# Patient Record
Sex: Female | Born: 1955 | Race: White | Hispanic: No | Smoking: Never smoker
Health system: Southern US, Community
[De-identification: ages and names within clinical notes are randomized; demographics above are authoritative.]

## PROBLEM LIST (undated history)

## (undated) DIAGNOSIS — J45909 Unspecified asthma, uncomplicated: Secondary | ICD-10-CM

## (undated) DIAGNOSIS — D219 Benign neoplasm of connective and other soft tissue, unspecified: Secondary | ICD-10-CM

## (undated) DIAGNOSIS — F419 Anxiety disorder, unspecified: Secondary | ICD-10-CM

## (undated) DIAGNOSIS — F329 Major depressive disorder, single episode, unspecified: Secondary | ICD-10-CM

## (undated) DIAGNOSIS — F32A Depression, unspecified: Secondary | ICD-10-CM

## (undated) HISTORY — DX: Unspecified asthma, uncomplicated: J45.909

## (undated) HISTORY — DX: Depression, unspecified: F32.A

## (undated) HISTORY — DX: Benign neoplasm of connective and other soft tissue, unspecified: D21.9

## (undated) HISTORY — DX: Anxiety disorder, unspecified: F41.9

## (undated) HISTORY — PX: DILATION AND CURETTAGE OF UTERUS: SHX78

## (undated) HISTORY — DX: Major depressive disorder, single episode, unspecified: F32.9

---

## 1998-12-18 ENCOUNTER — Other Ambulatory Visit: Admission: RE | Admit: 1998-12-18 | Discharge: 1998-12-18 | Payer: Self-pay | Admitting: *Deleted

## 2000-04-15 ENCOUNTER — Other Ambulatory Visit: Admission: RE | Admit: 2000-04-15 | Discharge: 2000-04-15 | Payer: Self-pay | Admitting: *Deleted

## 2001-12-21 ENCOUNTER — Other Ambulatory Visit: Admission: RE | Admit: 2001-12-21 | Discharge: 2001-12-21 | Payer: Self-pay | Admitting: *Deleted

## 2003-03-21 ENCOUNTER — Encounter (INDEPENDENT_AMBULATORY_CARE_PROVIDER_SITE_OTHER): Payer: Self-pay | Admitting: *Deleted

## 2003-03-21 ENCOUNTER — Other Ambulatory Visit: Admission: RE | Admit: 2003-03-21 | Discharge: 2003-03-21 | Payer: Self-pay | Admitting: *Deleted

## 2004-01-31 ENCOUNTER — Other Ambulatory Visit: Admission: RE | Admit: 2004-01-31 | Discharge: 2004-01-31 | Payer: Self-pay | Admitting: *Deleted

## 2005-08-25 ENCOUNTER — Ambulatory Visit: Payer: Self-pay | Admitting: Internal Medicine

## 2005-10-16 ENCOUNTER — Other Ambulatory Visit: Admission: RE | Admit: 2005-10-16 | Discharge: 2005-10-16 | Payer: Self-pay | Admitting: *Deleted

## 2006-07-07 ENCOUNTER — Ambulatory Visit: Payer: Self-pay | Admitting: Internal Medicine

## 2006-09-03 ENCOUNTER — Ambulatory Visit: Payer: Self-pay | Admitting: Internal Medicine

## 2006-10-15 ENCOUNTER — Other Ambulatory Visit: Admission: RE | Admit: 2006-10-15 | Discharge: 2006-10-15 | Payer: Self-pay | Admitting: *Deleted

## 2007-10-19 ENCOUNTER — Other Ambulatory Visit: Admission: RE | Admit: 2007-10-19 | Discharge: 2007-10-19 | Payer: Self-pay | Admitting: *Deleted

## 2008-09-25 ENCOUNTER — Ambulatory Visit: Payer: Self-pay | Admitting: Internal Medicine

## 2008-09-25 DIAGNOSIS — J45909 Unspecified asthma, uncomplicated: Secondary | ICD-10-CM | POA: Insufficient documentation

## 2008-09-25 DIAGNOSIS — J309 Allergic rhinitis, unspecified: Secondary | ICD-10-CM | POA: Insufficient documentation

## 2009-01-15 ENCOUNTER — Ambulatory Visit: Payer: Self-pay | Admitting: Internal Medicine

## 2009-01-15 LAB — CONVERTED CEMR LAB
AST: 20 units/L (ref 0–37)
Albumin: 4 g/dL (ref 3.5–5.2)
Alkaline Phosphatase: 50 units/L (ref 39–117)
BUN: 11 mg/dL (ref 6–23)
Bilirubin, Direct: 0.1 mg/dL (ref 0.0–0.3)
Blood in Urine, dipstick: NEGATIVE
Chloride: 108 meq/L (ref 96–112)
Eosinophils Absolute: 0.2 10*3/uL (ref 0.0–0.7)
Eosinophils Relative: 4.9 % (ref 0.0–5.0)
GFR calc Af Amer: 97 mL/min
GFR calc non Af Amer: 80 mL/min
HDL: 82.9 mg/dL (ref >39.0)
Ketones, urine, test strip: NEGATIVE
Monocytes Relative: 9.1 % (ref 3.0–12.0)
Neutrophils Relative %: 52.5 % (ref 43.0–77.0)
Nitrite: NEGATIVE
Platelets: 163 10*3/uL (ref 150–400)
Potassium: 3.9 meq/L (ref 3.5–5.1)
Protein, U semiquant: NEGATIVE
RDW: 13.4 % (ref 11.5–14.6)
Sodium: 143 meq/L (ref 135–145)
TSH: 2.65 microintl units/mL (ref 0.35–5.50)
Total CHOL/HDL Ratio: 2.3
Triglycerides: 45 mg/dL (ref 0–149)
Urobilinogen, UA: 0.2
VLDL: 9 mg/dL (ref 0–40)
WBC Urine, dipstick: NEGATIVE
WBC: 3.3 10*3/uL — ABNORMAL LOW (ref 4.5–10.5)

## 2009-01-17 LAB — CONVERTED CEMR LAB: Vit D, 25-Hydroxy: 28 ng/mL — ABNORMAL LOW (ref 30–89)

## 2009-01-22 ENCOUNTER — Ambulatory Visit: Payer: Self-pay | Admitting: Internal Medicine

## 2009-01-22 DIAGNOSIS — M899 Disorder of bone, unspecified: Secondary | ICD-10-CM | POA: Insufficient documentation

## 2009-01-22 DIAGNOSIS — M949 Disorder of cartilage, unspecified: Secondary | ICD-10-CM

## 2009-04-18 ENCOUNTER — Ambulatory Visit: Payer: Self-pay | Admitting: Internal Medicine

## 2009-04-18 LAB — CONVERTED CEMR LAB: Vit D, 25-Hydroxy: 41 ng/mL (ref 30–89)

## 2009-04-27 ENCOUNTER — Ambulatory Visit: Payer: Self-pay | Admitting: Internal Medicine

## 2009-04-27 DIAGNOSIS — T753XXA Motion sickness, initial encounter: Secondary | ICD-10-CM

## 2010-12-15 LAB — CONVERTED CEMR LAB: Pap Smear: NORMAL

## 2011-05-12 ENCOUNTER — Emergency Department (HOSPITAL_COMMUNITY): Payer: BC Managed Care – PPO

## 2011-05-12 ENCOUNTER — Emergency Department (HOSPITAL_COMMUNITY)
Admission: EM | Admit: 2011-05-12 | Discharge: 2011-05-12 | Disposition: A | Discharge status: Home / Self Care | Payer: BC Managed Care – PPO | Attending: Emergency Medicine | Admitting: Emergency Medicine

## 2011-05-12 DIAGNOSIS — R110 Nausea: Secondary | ICD-10-CM | POA: Insufficient documentation

## 2011-05-12 DIAGNOSIS — R1031 Right lower quadrant pain: Secondary | ICD-10-CM | POA: Insufficient documentation

## 2011-05-12 LAB — POCT I-STAT, CHEM 8
BUN: 7 mg/dL (ref 6–23)
Calcium, Ion: 1.22 mmol/L (ref 1.12–1.32)
Chloride: 107 mEq/L (ref 96–112)
Creatinine, Ser: 0.7 mg/dL (ref 0.50–1.10)
Glucose, Bld: 81 mg/dL (ref 70–99)
TCO2: 24 mmol/L (ref 0–100)

## 2011-05-12 LAB — URINALYSIS, ROUTINE W REFLEX MICROSCOPIC
Bilirubin Urine: NEGATIVE
Glucose, UA: NEGATIVE mg/dL
Hgb urine dipstick: NEGATIVE
Leukocytes, UA: NEGATIVE
Protein, ur: NEGATIVE mg/dL
pH: 5 (ref 5.0–8.0)

## 2011-05-12 LAB — DIFFERENTIAL
Basophils Relative: 1 % (ref 0–1)
Lymphs Abs: 1.2 10*3/uL (ref 0.7–4.0)
Monocytes Absolute: 0.4 10*3/uL (ref 0.1–1.0)
Monocytes Relative: 9 % (ref 3–12)
Neutro Abs: 2.2 10*3/uL (ref 1.7–7.7)

## 2011-05-12 LAB — CBC
Hemoglobin: 14.4 g/dL (ref 12.0–15.0)
MCH: 30.2 pg (ref 26.0–34.0)
MCHC: 33.4 g/dL (ref 30.0–36.0)
MCV: 90.4 fL (ref 78.0–100.0)
RBC: 4.77 MIL/uL (ref 3.87–5.11)

## 2011-05-12 MED ORDER — IOHEXOL 300 MG/ML  SOLN
100.0000 mL | Freq: Once | INTRAMUSCULAR | Status: AC | PRN
  Administered 2011-05-12: 100 mL via INTRAVENOUS

## 2013-06-18 ENCOUNTER — Ambulatory Visit (INDEPENDENT_AMBULATORY_CARE_PROVIDER_SITE_OTHER): Payer: BC Managed Care – PPO | Admitting: Internal Medicine

## 2013-06-18 ENCOUNTER — Ambulatory Visit: Payer: BC Managed Care – PPO

## 2013-06-18 VITALS — BP 140/88 | HR 65 | Temp 98.6°F | Resp 18 | Ht 65.0 in | Wt 127.0 lb

## 2013-06-18 DIAGNOSIS — S93409A Sprain of unspecified ligament of unspecified ankle, initial encounter: Secondary | ICD-10-CM

## 2013-06-18 DIAGNOSIS — M25571 Pain in right ankle and joints of right foot: Secondary | ICD-10-CM

## 2013-06-18 DIAGNOSIS — M25579 Pain in unspecified ankle and joints of unspecified foot: Secondary | ICD-10-CM

## 2013-06-18 NOTE — Progress Notes (Signed)
  Subjective:    Patient ID: Savannah Bird, female    DOB: [birth year], 57 y.o.   MRN: [id number]  HPI tripped with ankle hyper plantar flexed yesterday--immediate swelling laterally Today swelling bimalleolar with painful gait but is able to walk    Review of Systems     Objective:   Physical Exam BP 140/88  Pulse 65  Temp(Src) 98.6 F (37 C)  Resp 18  Ht 5\' 5"  (1.651 m)  Wt 127 lb (57.607 kg)  BMI 21.13 kg/m2 No acute distress Right ankle swollen over lateral and medial epicondyles with some ecchymoses Tender lateral ligaments/tender deltoid/tender over fifth metatarsal proximally/mild tenderness over the dorsum of the foot Good range of motion without laxity Achilles intact   [facility] reading (PRIMARY) by  Dr. Kris No=No Fx      Assessment & Plan:  Bimalleolar ankle sprain  Swedo Brace Exercises Ice and elevate Recheck 3 weeks

## 2013-08-03 ENCOUNTER — Encounter: Payer: Self-pay | Admitting: Certified Nurse Midwife

## 2013-08-03 ENCOUNTER — Encounter: Payer: Self-pay | Admitting: Internal Medicine

## 2013-08-03 ENCOUNTER — Ambulatory Visit (INDEPENDENT_AMBULATORY_CARE_PROVIDER_SITE_OTHER): Payer: BC Managed Care – PPO | Admitting: Certified Nurse Midwife

## 2013-08-03 ENCOUNTER — Other Ambulatory Visit: Payer: Self-pay | Admitting: Certified Nurse Midwife

## 2013-08-03 VITALS — BP 102/62 | HR 64 | Resp 16 | Ht 64.75 in | Wt 128.0 lb

## 2013-08-03 DIAGNOSIS — E559 Vitamin D deficiency, unspecified: Secondary | ICD-10-CM

## 2013-08-03 DIAGNOSIS — Z0000 Encounter for general adult medical examination without abnormal findings: Secondary | ICD-10-CM

## 2013-08-03 DIAGNOSIS — R5381 Other malaise: Secondary | ICD-10-CM

## 2013-08-03 DIAGNOSIS — Z01419 Encounter for gynecological examination (general) (routine) without abnormal findings: Secondary | ICD-10-CM

## 2013-08-03 LAB — POCT URINALYSIS DIPSTICK
Bilirubin, UA: NEGATIVE
Blood, UA: NEGATIVE
Ketones, UA: NEGATIVE
Leukocytes, UA: NEGATIVE
Protein, UA: NEGATIVE
pH, UA: 5

## 2013-08-03 NOTE — Progress Notes (Signed)
57 y.o. g1p1001. Divorced Caucasian Fe here to establish gyn care and  for annual exam. Menopausal no HRT. Denies vaginal bleeding or vaginal dryness. Injured right foot with sprain 6 weeks ago, healing, but slowly.  Complaining of heat intolerance and fatigue over the past few weeks. Patient is vegetarian but eats seafood, and eggs. Water intake is adequate. Feels she can do her job and household responsibilities. 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 History of depression, but feels that she is fine. No other health issues today. Sees PCP prn.  Patient's last menstrual period was 06/17/2009.          Sexually active: no  The current method of family planning is abstinence.    Exercising: yes  walk,run & weights Smoker:  no  Health Maintenance: Pap:  2010 negative MMG:  2009 negative Colonoscopy: none BMD: none TDaP:  2010 Labs: Poct urine-neg, Hgb-13.4 Self breast exam: done monthly   reports that she has never smoked. She does not have any smokeless tobacco history on file. She reports that she drinks about 0.6 ounces of alcohol per week. She reports that she does not use illicit drugs.  Past Medical History  Diagnosis Date  . Childhood asthma   . Depression   . Anxiety   . Fibroid     Past Surgical History  Procedure Laterality Date  . Cesarean section    . Dilation and curettage of uterus      No current outpatient prescriptions on file.   No current facility-administered medications for this visit.    Family History  Problem Relation Age of Onset  . Thyroid disease Mother   . Stroke Father   . Heart disease Father   . Hypertension Father   . Cancer Father     bladder & kidney  . Aneurysm Maternal Grandmother   . Heart disease Maternal Grandfather   . Cancer Paternal Grandmother     ovarian    ROS:  Pertinent items are noted in HPI.  Otherwise, a comprehensive ROS was negative.  Exam:   BP 102/62  Pulse 64  Resp 16  Ht 5' 4.75"  (1.645 m)  Wt 128 lb (58.06 kg)  BMI 21.46 kg/m2  LMP 06/17/2009 Height: 5' 4.75" (164.5 cm)  Ht Readings from Last 3 Encounters:  08/03/13 5' 4.75" (1.645 m)  06/18/13 5\' 5"  (1.651 m)  04/27/09 5\' 5"  (1.651 m)    General appearance: alert, cooperative and appears stated age Head: Normocephalic, without obvious abnormality, atraumatic Neck: no adenopathy, supple, symmetrical, trachea midline and thyroid normal to inspection and palpation Lungs: clear to auscultation bilaterally Breasts: normal appearance, no masses or tenderness, No nipple retraction or dimpling, No nipple discharge or bleeding, No axillary or supraclavicular adenopathy Heart: regular rate and rhythm Abdomen: soft, non-tender; no masses,  no organomegaly Extremities: extremities normal, atraumatic, no cyanosis or edema Skin: Skin color, texture, turgor normal. No rashes or lesions Lymph nodes: Cervical, supraclavicular, and axillary nodes normal. No abnormal inguinal nodes palpated Neurologic: Grossly normal   Pelvic: External genitalia:  no lesions              Urethra:  normal appearing urethra with no masses, tenderness or lesions              Bartholin's and Skene's: normal                 Vagina: normal appearing vagina with normal color and discharge, no lesions  Cervix: normal, non tender              Pap taken: yes Bimanual Exam:  Uterus:  normal size, contour, position, consistency, mobility, non-tender and mid position              Adnexa: normal adnexa and no mass, fullness, tenderness               Rectovaginal: Confirms               Anus:  normal sphincter tone, no lesions  A:  Well Woman with normal exam  Menopausal no HRT  Fatigue with Hot and cold intoerance,but employment is body work business( very physical)  History of depression(declines symptoms at present)  P:   Reviewed health and wellness pertinent to exam  Aware of need to evaluate if vaginal bleeding occurs  Discussed  physical work may be a contributing factor. Also discussed depression can also cause fatigue like symptoms. Encouraged multivitamin with B complex, increase protein in diet and rest. Lab:CMP,Lipid panel,CBC with diff, thyroid panel with TSH, Vit. D  Pap smear as per guidelines   Mammogram yearly pap smear taken with HPVHR   counseled on breast self exam, mammography screening, use and side effects of HRT, adequate intake of calcium and vitamin D, diet and exercise, Kegel's exercises  return annually or prn  An After Visit Summary was printed and given to the patient.

## 2013-08-03 NOTE — Patient Instructions (Signed)

## 2013-08-04 LAB — COMPREHENSIVE METABOLIC PANEL
AST: 17 U/L (ref 0–37)
BUN: 10 mg/dL (ref 6–23)
Calcium: 10.5 mg/dL (ref 8.4–10.5)
Chloride: 105 mEq/L (ref 96–112)
Creat: 0.75 mg/dL (ref 0.50–1.10)
Total Bilirubin: 0.6 mg/dL (ref 0.3–1.2)

## 2013-08-04 LAB — CBC WITH DIFFERENTIAL/PLATELET
Basophils Absolute: 0 10*3/uL (ref 0.0–0.1)
Basophils Relative: 1 % (ref 0–1)
Eosinophils Absolute: 0.3 10*3/uL (ref 0.0–0.7)
Eosinophils Relative: 5 % (ref 0–5)
MCH: 30.6 pg (ref 26.0–34.0)
MCV: 92.7 fL (ref 78.0–100.0)
Neutrophils Relative %: 55 % (ref 43–77)
Platelets: 235 10*3/uL (ref 150–400)
RDW: 13.4 % (ref 11.5–15.5)

## 2013-08-04 LAB — VITAMIN D 25 HYDROXY (VIT D DEFICIENCY, FRACTURES): Vit D, 25-Hydroxy: 46 ng/mL (ref 30–89)

## 2013-08-04 LAB — LIPID PANEL
Cholesterol: 190 mg/dL (ref 0–200)
HDL: 74 mg/dL (ref >39)
LDL Cholesterol: 98 mg/dL (ref 0–99)
Triglycerides: 90 mg/dL (ref <150)
VLDL: 18 mg/dL (ref 0–40)

## 2013-08-04 LAB — THYROID PANEL WITH TSH
Free Thyroxine Index: 2.9 (ref 1.0–3.9)
T4, Total: 9.2 ug/dL (ref 5.0–12.5)
TSH: 1.788 u[IU]/mL (ref 0.350–4.500)

## 2013-08-11 NOTE — Progress Notes (Signed)
Note reviewed, agree with plan.  Lucindia Lemley, MD  

## 2013-09-22 ENCOUNTER — Other Ambulatory Visit: Payer: Self-pay

## 2013-10-06 ENCOUNTER — Encounter: Payer: Self-pay | Admitting: Certified Nurse Midwife

## 2013-10-06 ENCOUNTER — Telehealth: Payer: Self-pay | Admitting: Certified Nurse Midwife

## 2013-10-06 ENCOUNTER — Ambulatory Visit (INDEPENDENT_AMBULATORY_CARE_PROVIDER_SITE_OTHER): Payer: BC Managed Care – PPO | Admitting: Certified Nurse Midwife

## 2013-10-06 VITALS — Temp 98.3°F | Ht 64.75 in | Wt 130.0 lb

## 2013-10-06 DIAGNOSIS — B373 Candidiasis of vulva and vagina: Secondary | ICD-10-CM

## 2013-10-06 DIAGNOSIS — N390 Urinary tract infection, site not specified: Secondary | ICD-10-CM

## 2013-10-06 LAB — POCT URINALYSIS DIPSTICK
Blood, UA: NEGATIVE
Nitrite, UA: NEGATIVE
Protein, UA: NEGATIVE
Urobilinogen, UA: NEGATIVE
pH, UA: 5

## 2013-10-06 MED ORDER — NITROFURANTOIN MONOHYD MACRO 100 MG PO CAPS: 100.0000 mg | ORAL_CAPSULE | Freq: Two times a day (BID) | ORAL | Status: DC

## 2013-10-06 NOTE — Progress Notes (Signed)
S:  @57  y.o.Divorced Caucasian female presents with 3 days of UTI  symptoms of urinary frequency, clear vaginal discharge, burning in bladder area. Onset of symptoms for. Pertinent negatives include The patient is having no constitutional symptoms, denying fever, chills, anorexia, or weight loss.. Patient had viral illness or food poisoning with diarrhea, fever and chills a week ago, has resolved. Drinking good amount of water. Patient has taken Goldenseal and berry extract for UTI with some relief. Patient has some vaginal discomfort but no itching or burning?Marland Kitchen No new personal products. ROS: pertinent to above.  O alert, oriented to person, place, and time   healthy, well developed and well nourished  Skin warm and dry  CVAT negative bilateral  Abdomen: slight suprapubic tenderness  Pelvic Exam:External genital area normal no lesions  Bladder,urethra, urethral meatus tender  Vagina: moist with clear discharge, slight increase pink     Ph 4.0 Wet prep taken  Uterus: normal, non tender  Adnexa: normal, non tender, no masses palpated  Perineal area: no lesions or redness   POCT urine: negative Wet prep: positive yeast    Assessment:Symptomatic UTI Yeast vaginitis  P: Discussed findings and need for treatment. Patient agreeable. Rx Macrobid see order Lab: Urine culture  Maintain adequate hydration. Follow up if symptoms not improving, and as needed. Discussed treatment for yeast. Patient declined will treat with probiotics and Kefer. Will advise if not resolving   RV prn

## 2013-10-10 ENCOUNTER — Encounter: Payer: Self-pay | Admitting: Certified Nurse Midwife

## 2013-10-10 ENCOUNTER — Telehealth: Payer: Self-pay

## 2013-10-10 NOTE — Telephone Encounter (Signed)
Message copied by Eliezer Bottom on Mon Oct 10, 2013  4:48 PM ------      Message from: Verner Chol      Created: Mon Oct 10, 2013 12:39 PM       Notify patient urine culture negative      No TOC needed      Patient status ------

## 2013-10-10 NOTE — Telephone Encounter (Signed)
[email or web address]

## 2013-10-10 NOTE — Progress Notes (Signed)
Note reviewed, agree with plan.  Nikeya Maxim, MD  

## 2013-10-18 NOTE — Telephone Encounter (Signed)
Left message for call back.

## 2013-10-20 NOTE — Telephone Encounter (Signed)
Patient notified of results.

## 2014-08-05 ENCOUNTER — Ambulatory Visit (INDEPENDENT_AMBULATORY_CARE_PROVIDER_SITE_OTHER): Payer: BC Managed Care – PPO

## 2014-08-05 ENCOUNTER — Telehealth: Payer: Self-pay | Admitting: *Deleted

## 2014-08-05 ENCOUNTER — Ambulatory Visit (INDEPENDENT_AMBULATORY_CARE_PROVIDER_SITE_OTHER): Payer: BC Managed Care – PPO | Admitting: Internal Medicine

## 2014-08-05 VITALS — BP 110/80 | HR 63 | Temp 98.0°F | Resp 16 | Ht 66.0 in | Wt 131.0 lb

## 2014-08-05 DIAGNOSIS — S5000XA Contusion of unspecified elbow, initial encounter: Secondary | ICD-10-CM

## 2014-08-05 DIAGNOSIS — M25529 Pain in unspecified elbow: Secondary | ICD-10-CM

## 2014-08-05 DIAGNOSIS — S5002XA Contusion of left elbow, initial encounter: Secondary | ICD-10-CM

## 2014-08-05 DIAGNOSIS — M25522 Pain in left elbow: Secondary | ICD-10-CM

## 2014-08-05 NOTE — Telephone Encounter (Signed)
Patient notified that radiologist see's a possible fracture in her right elbow. She is to follow up in 7 -10 days with Dr. Elder Cyphers or Dr. Laney Pastor per Dr. Elder Cyphers.

## 2014-08-05 NOTE — Progress Notes (Signed)
   Subjective:    Patient ID: Savannah Bird, female    DOB: 1956-08-18, 58 y.o.   MRN: [id number]  HPI    Review of Systems     Objective:   Physical Exam        Assessment & Plan:

## 2014-08-05 NOTE — Progress Notes (Signed)
   Subjective:    Patient ID: Savannah Bird, female    DOB: 1956-05-26, 58 y.o.   MRN: [id number]  HPI A 58 year old female is here with an left elbow injury that occurred on Friday 08/04/2014.Marland Kitchen  Patient states she fell and can not straighten her arm; however she is able to bend it across the chest.  She states it feels like an fracture to her.  Her elbow is presented with some pain and mild edema.   Review of Systems     Objective:   Physical Exam  Constitutional: She appears well-developed and well-nourished. She appears distressed.  HENT:  Head: Normocephalic.  Eyes: EOM are normal.  Neck: Normal range of motion.  Pulmonary/Chest: Effort normal.  Musculoskeletal:       Left elbow: She exhibits decreased range of motion, swelling and effusion. She exhibits no deformity and no laceration. Tenderness found. Lateral epicondyle tenderness noted. No radial head, no medial epicondyle and no olecranon process tenderness noted.  Neurological: She has normal strength. No cranial nerve deficit or sensory deficit.  Skin: Skin is intact. Ecchymosis noted. No abrasion and no laceration noted.     [facility] reading (PRIMARY) by  Dr.Guest possible impaction radial head, sts around distal humerus above joint, fat pad.  Urgent read please     Assessment & Plan:  Contusion elbow with fat pad sign RICE/Sling

## 2014-08-05 NOTE — Patient Instructions (Signed)
Elbow Fracture, Distal Humerus with Rehab A break (fracture) of the elbow occurs in one or more of the three bones of the elbow: the arm bone (humerus), the end of the thumb-side forearm bone (radial head) or the end of the pinky-side forearm bone (olecranon). Elbow fractures may be complete or incomplete. The following discussion does not address radial head fractures. SYMPTOMS   Severe elbow and arm pain at the time of injury.  Tenderness, inflammation, and later bruising (contusion) of the elbow (within 48 hours).  Visible deformity if the fracture is complete and the bone fragments are not aligned properly (displaced).  Numbness, coldness, or paralysis in the elbow, forearm, or hand from pressure on the blood vessels or nerves (uncommon). CAUSES  An elbow fracture occurs when a force is placed on the bone that is greater than it can withstand. Typical mechanisms of injury include:  Direct trauma to the elbow.  Twisting injury to the elbow.  Indirect stress due to falling on an outstretched hand with the elbow locked. RISK INCREASES WITH:   Contact sports (football or rugby).  Children under 33 years of age.  History of bone or joint disease (i.e., osteoporosis or bone tumor).  Poor strength and flexibility. PREVENTION   Warm up and stretch properly before activity.  Maintain physical fitness:  Strength, flexibility, and endurance.  Cardiovascular fitness.  When appropriate, wear properly fitted and padded elbow protection. PROGNOSIS  If treated properly, elbow fractures typically heal within 6 to 8 weeks for adults and 4 to 6 weeks in children.  RELATED COMPLICATIONS   The ends of the fractured bone do not come together (nonunion).  Improper alignment after healing (malunion) of the fracture.  Chronic pain, stiffness, loss of motion, or swelling of the elbow.  Excessive bleeding in the elbow or at the fracture site, causing pressure and injury to nerves and blood  vessels (uncommon).  Calcification of the soft tissues around the elbow (heterotopic ossification).  Risk of bone death due to interrupted blood supply associated with the fracture.  Unstable or arthritic joint following repeated injury.  Arrest of normal bone growth in children.  Atrophy, weakness, stiffness, numbness, and poor control of the hand due to injury to blood vessels, nerves, cartilage, muscle, ligaments, and fascia. TREATMENT  If the fracture is displaced, it must be put back in proper alignment (reduced) by an individual trained in the procedure. Oftentimes, displaced fractures cannot be reduced manually and require surgery. Once the bones are properly aligned (with or without surgery), ice and medication can be used to help reduce the pain and inflammation. The elbow should be immobilized for at least 6 weeks. After immobilization it is important to complete strengthening and stretching exercises to regain strength and a full range of motion. Theses exercises may be completed at home or with a therapist.  MEDICATION   If pain medication is necessary, then nonsteroidal anti-inflammatory medications, such as aspirin and ibuprofen, or other minor pain relievers, such as acetaminophen, are often recommended.  Do not take pain medication within 7 days before surgery.  Prescription pain relievers may be given if deemed necessary by your caregiver. Use only as directed and only as much as you need. COLD THERAPY  Cold treatment (icing) relieves pain and reduces inflammation. Cold treatment should be applied for 10 to 15 minutes every 2 to 3 hours for inflammation and pain and immediately after any activity that aggravates your symptoms. Use ice packs or an ice massage. SEEK MEDICAL CARE IF:  Pain, tenderness, or swelling worsens despite treatment.  You experience pain, numbness, or coldness in the hand.  Blue, gray, or dusky color appears in the fingernails.  Any of the following  occur after surgery: fever, increased pain, swelling, redness, drainage, or bleeding in the surgical area.  New, unexplained symptoms develop (drugs used in treatment may produce side effects). EXERCISES  RANGE OF MOTION (ROM) AND STRETCHING EXERCISES - Elbow Fracture (Distal Humerus, Olecranon) These exercises may help you restore your elbow mobility once your physician has discontinued your immobilization period. Your symptoms may resolve with or without further involvement from your physician, physical therapist, or athletic trainer. While completing these exercises, remember:   Restoring tissue flexibility helps normal motion to return to the joints. This allows healthier, less painful movement and activity.  An effective stretch should be held for at least 30 seconds.  A stretch should never be painful. You should only feel a gentle lengthening or release in the stretched tissue. RANGE OF MOTION - Supination, Active-Assisted  Sit with your right / left elbow bent 90 degrees and rest your forearm on a table.  Keeping your upper body and shoulder in place, roll your forearm so your palm faces upward. When you can go no farther, use your opposite hand to help until you feel a gentle to moderate stretch. Hold for __________ seconds.  Slowly release the stretch and return to the starting position. Repeat __________ times. Complete this exercise __________ times per day. RANGE OF MOTION - Pronation, Active-Assisted  Sit with your right / left elbow bent 90 degrees and rest your forearm on a table.  Keeping your upper body and shoulder in place, roll your forearm so your palm faces the tabletop. When you can go no farther, use your opposite hand to help until you feel a gentle to moderate stretch. Hold for __________ seconds.  Slowly release the stretch and return to the starting position. Repeat __________ times. Complete this exercise __________ times per day. RANGE OF MOTION -  Extension  Hold your right / left arm at your side and straighten your elbow as far as you can using your right / left arm muscles.  Straighten the right / left elbow farther by gently pushing down on your forearm until you feel a gentle stretch on the inside of your elbow. Hold this position for __________ seconds.  Slowly return to the starting position. Repeat __________ times. Complete this exercise __________ times per day.  RANGE OF MOTION - Flexion  Hold your right / left arm at your side and bend your elbow as far as you can using your right / left arm muscles.  Bend the right / left elbow farther by gently pushing up on your forearm until you feel a gentle stretch on the outside of your elbow. Hold this position for __________ seconds.  Slowly return to the starting position. Repeat __________ times. Complete this exercise __________ times per day.  RANGE OF MOTION - Supination, Active  Stand or sit with your elbows at your side. Bend your right / left elbow to 90 degrees.  Turn your palm upward until you feel a gentle stretch on the inside of your forearm.  Hold this position for __________ seconds. Slowly release and return to the starting position. Repeat __________ times. Complete this stretch __________ times per day.  RANGE OF MOTION - Pronation, Active  Stand or sit with your elbows at your side. Bend your right / left elbow to 90 degrees.  Turn your palm downward until you feel a gentle stretch on the top of your forearm.  Hold this position for __________ seconds. Slowly release and return to the starting position. Repeat __________ times. Complete this stretch __________ times per day.  RANGE OF MOTION - Elbow Flexion, Supine  Lie on your back. Extend your right / left arm into the air, bracing it with your opposite hand. Allow your right / left arm to relax.  Let your elbow bend, allowing your hand to fall slowly toward your chest.  You should feel a gentle  stretch along the back of your upper arm and/or elbow. Your physician, physical therapist or athletic trainer may ask you to hold a __________ hand weight to increase the intensity of this stretch.  Hold for __________ seconds. Slowly return your right / left arm to the upright position. Repeat __________ times. Complete this exercise __________ times per day. STRETCH - Elbow Flexors  Lie on a firm bed or countertop on your back. Be sure that you are in a comfortable position which will allow you to relax your arm muscles.  Place a folded towel under your upper arm so that your elbow and shoulder are at the same height. Extend your arm; your elbow should not rest on the bed or towel  Allow the weight of your hand to straighten your elbow. Keep your arm and chest muscles relaxed. Your caregiver may ask you to increase the intensity of your stretch by adding a small wrist or hand weight.  Hold for __________ seconds. You should feel a stretch on the inside of your elbow. Slowly return to the starting position. Repeat __________ times. Complete this exercise __________ times per day. STRENGTHENING EXERCISES - Elbow Fracture (Distal Humerus, Olecranon) These exercises may help you regain your strength after your physician has discontinued your immobilization in a cast or brace. They may resolve your symptoms with or without further involvement from your physician, physical therapist or athletic trainer. While completing these exercises, remember:   Muscles can gain both the endurance and the strength needed for everyday activities through controlled exercises.  Complete these exercises as instructed by your physician, physical therapist or athletic trainer. Progress the resistance and repetitions only as guided.  You may experience muscle soreness or fatigue, but the pain or discomfort you are trying to eliminate should never worsen during these exercises. If this pain does worsen, stop and make  certain you are following the directions exactly. If the pain is still present after adjustments, discontinue the exercise until you can discuss the trouble with your clinician. STRENGTH - Elbow Flexors, Isometric  Stand or sit upright on a firm surface. Place your right / left arm so that your hand is palm-up and at the height of your waist.  Place your opposite hand on top of your forearm. Gently push down as your right / left arm resists. Push as hard as you can with both arms without causing any pain or movement at your right / left elbow. Hold this stationary position for __________ seconds.  Gradually release the tension in both arms. Allow your muscles to relax completely before repeating. Repeat __________ times. Complete this exercise __________ times per day. STRENGTH - Elbow Extensors, Isometric  Stand or sit upright on a firm surface. Place your right / left arm so that your palm faces your abdomen and it is at the height of your waist.  Place your opposite hand on the underside of your forearm. Gently push  up as your right / left arm resists. Push as hard as you can with both arms without causing any pain or movement at your right / left elbow. Hold this stationary position for __________ seconds.  Gradually release the tension in both arms. Allow your muscles to relax completely before repeating. Repeat __________ times. Complete this exercise __________ times per day. STRENGTH - Elbow Flexors, Supinated  With good posture, stand or sit on a firm chair without armrests. Allow your right / left arm to rest at your side with your palm facing forward.  Holding a __________ weight or gripping a rubber exercise band/tubing, bring your hand toward your shoulder.  Allow your muscles to control the resistance as your hand returns to your side. Repeat __________ times. Complete this exercise __________ times per day.  STRENGTH - Elbow Extensors, Dynamic  With good posture, stand or  sit on a firm chair without armrests. Keeping your upper arms at your side, bring both hands up to your right / left shoulder while gripping a rubber exercise band/tubing. Your right / left hand should be just below the other hand.  Straighten your right / left elbow. Hold for __________ seconds.  Allow your muscles to control the rubber exercise band/tubing as your hand returns to your shoulder. Repeat __________ times. Complete this exercise __________ times per day. STRENGTH - Forearm Supinators   Sit with your right / left forearm supported on a table, keeping your elbow below shoulder height. Rest your hand over the edge, palm down.  Gently grip a hammer or a soup ladle.  Without moving your elbow, slowly turn your palm and hand upward to a "thumbs-up" position.  Hold this position for __________ seconds. Slowly return to the starting position. Repeat __________ times. Complete this exercise __________ times per day.  STRENGTH - Forearm Pronators  Sit with your right / left forearm supported on a table, keeping your elbow below shoulder height. Rest your hand over the edge, palm up.  Gently grip a hammer or a soup ladle.  Without moving your elbow, slowly turn your palm and hand upward to a "thumbs-up" position.  Hold this position for __________ seconds. Slowly return to the starting position. Repeat __________ times. Complete this exercise __________ times per day.  Document Released: 11/03/2005 Document Revised: 03/20/2014 Document Reviewed: 02/15/2009 [facility] Patient Information 2015 [place], [place]. This information is not intended to replace advice given to you by your health care provider. Make sure you discuss any questions you have with your health care provider. Elbow Contusion An elbow contusion is a deep bruise of the elbow. Contusions are the result of an injury that caused bleeding under the skin. The contusion may turn blue, purple, or yellow. Minor injuries will  give you a painless contusion, but more severe contusions may stay painful and swollen for a few weeks.  CAUSES  An elbow contusion comes from a direct force to that area, such as falling on the elbow. SYMPTOMS   Swelling and redness of the elbow.  Bruising of the elbow area.  Tenderness or soreness of the elbow. DIAGNOSIS  You will have a physical exam and will be asked about your history. You may need an X-ray of your elbow to look for a broken bone (fracture).  TREATMENT  A sling or splint may be needed to support your injury. Resting, elevating, and applying cold compresses to the elbow area are often the best treatments for an elbow contusion. Over-the-counter medicines may also be recommended  for pain control. HOME CARE INSTRUCTIONS   Put ice on the injured area.  Put ice in a plastic bag.  Place a towel between your skin and the bag.  Leave the ice on for 15-20 minutes, 03-04 times a day.  Only take over-the-counter or prescription medicines for pain, discomfort, or fever as directed by your caregiver.  Rest your injured elbow until the pain and swelling are better.  Elevate your elbow to reduce swelling.  Apply a compression wrap as directed by your caregiver. This can help reduce swelling and motion. You may remove the wrap for sleeping, showers, and baths. If your fingers become numb, cold, or blue, take the wrap off and reapply it more loosely.  Use your elbow only as directed by your caregiver. You may be asked to do range of motion exercises. Do them as directed.  See your caregiver as directed. It is very important to keep all follow-up appointments in order to avoid any long-term problems with your elbow, including chronic pain or inability to move your elbow normally. SEEK IMMEDIATE MEDICAL CARE IF:   You have increased redness, swelling, or pain in your elbow.  Your swelling or pain is not relieved with medicines.  You have swelling of the hand and  fingers.  You are unable to move your fingers or wrist.  You begin to lose feeling in your hand or fingers.  Your fingers or hand become cold or blue. MAKE SURE YOU:   Understand these instructions.  Will watch your condition.  Will get help right away if you are not doing well or get worse. Document Released: 10/12/2006 Document Revised: 01/26/2012 Document Reviewed: 09/19/2011 [facility] Patient Information 2015 [place], [place]. This information is not intended to replace advice given to you by your health care provider. Make sure you discuss any questions you have with your health care provider.

## 2015-05-14 IMAGING — CR DG ELBOW COMPLETE 3+V*L*
4 series · 4 of 4 positions shown · non-contrast
Comparison: None.

CLINICAL DATA: Status post fall yesterday

EXAM:
LEFT ELBOW - COMPLETE 3+ VIEW

[lateral (1 of 2)]
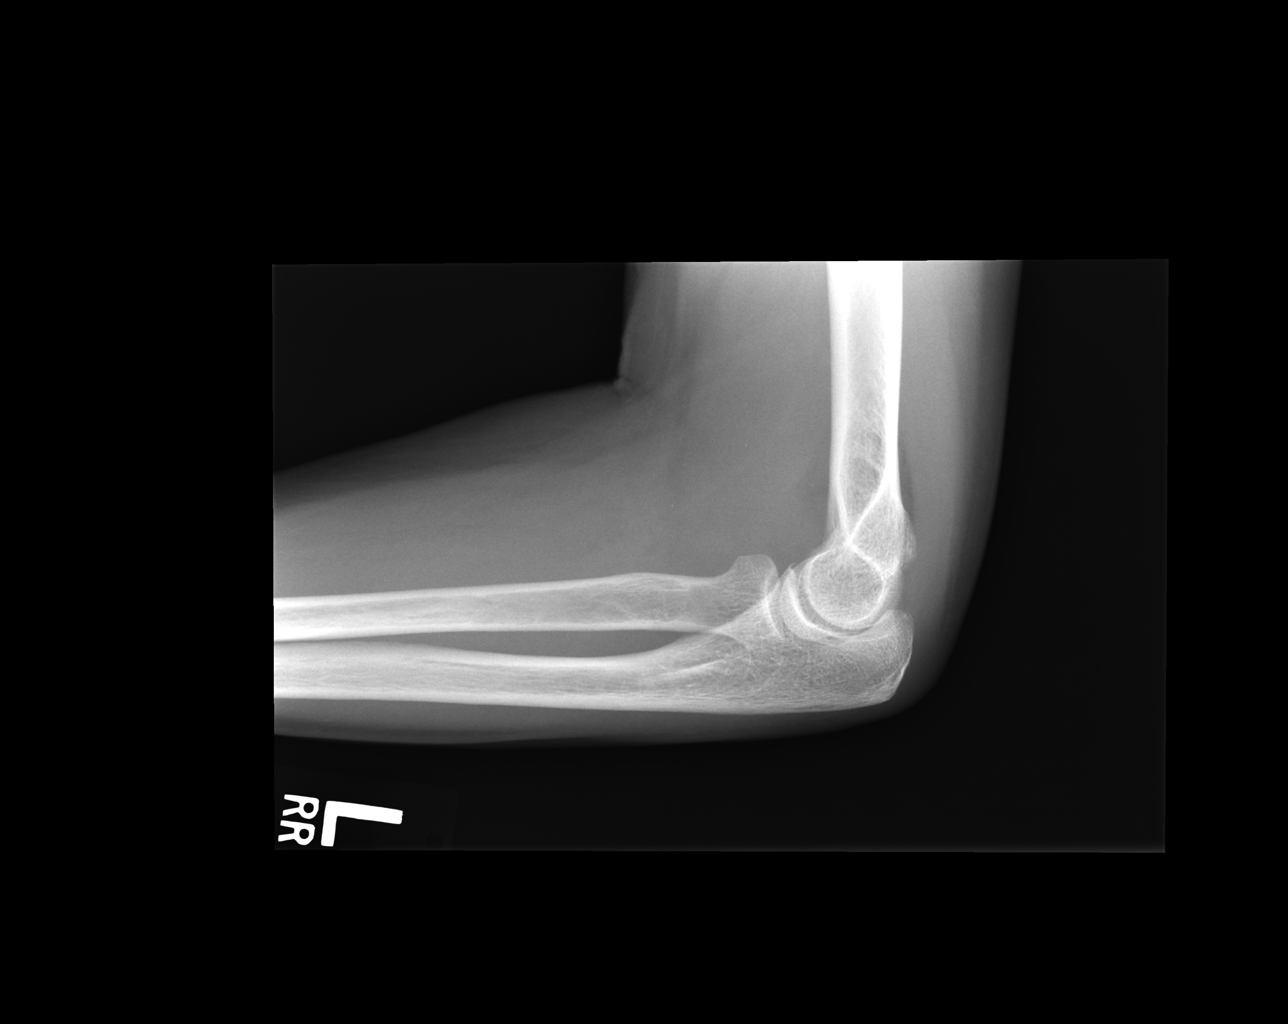

[lateral (2 of 2)]
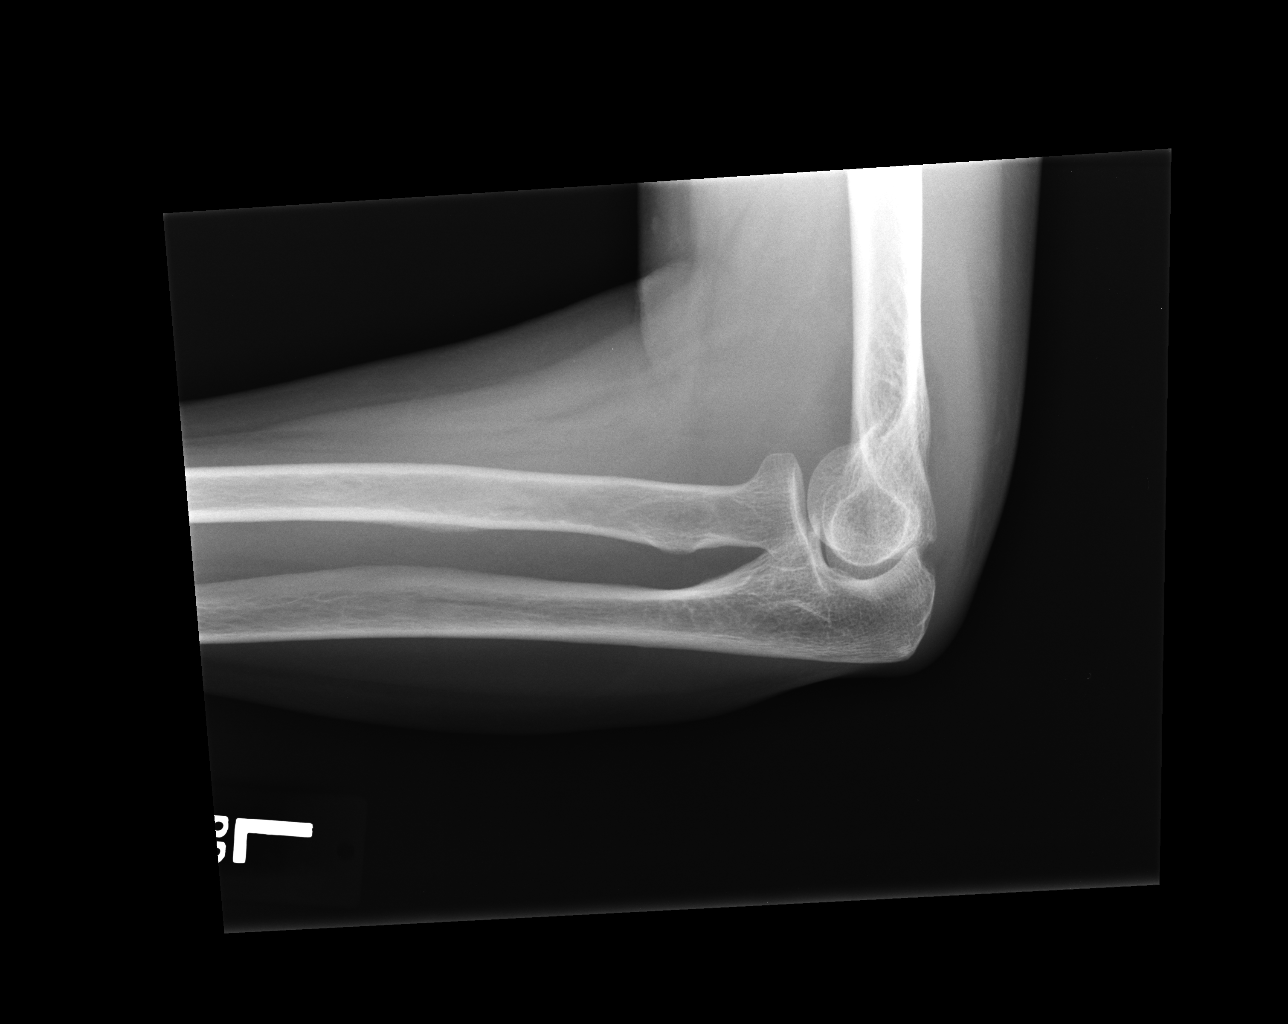

[ap obl ext rot]
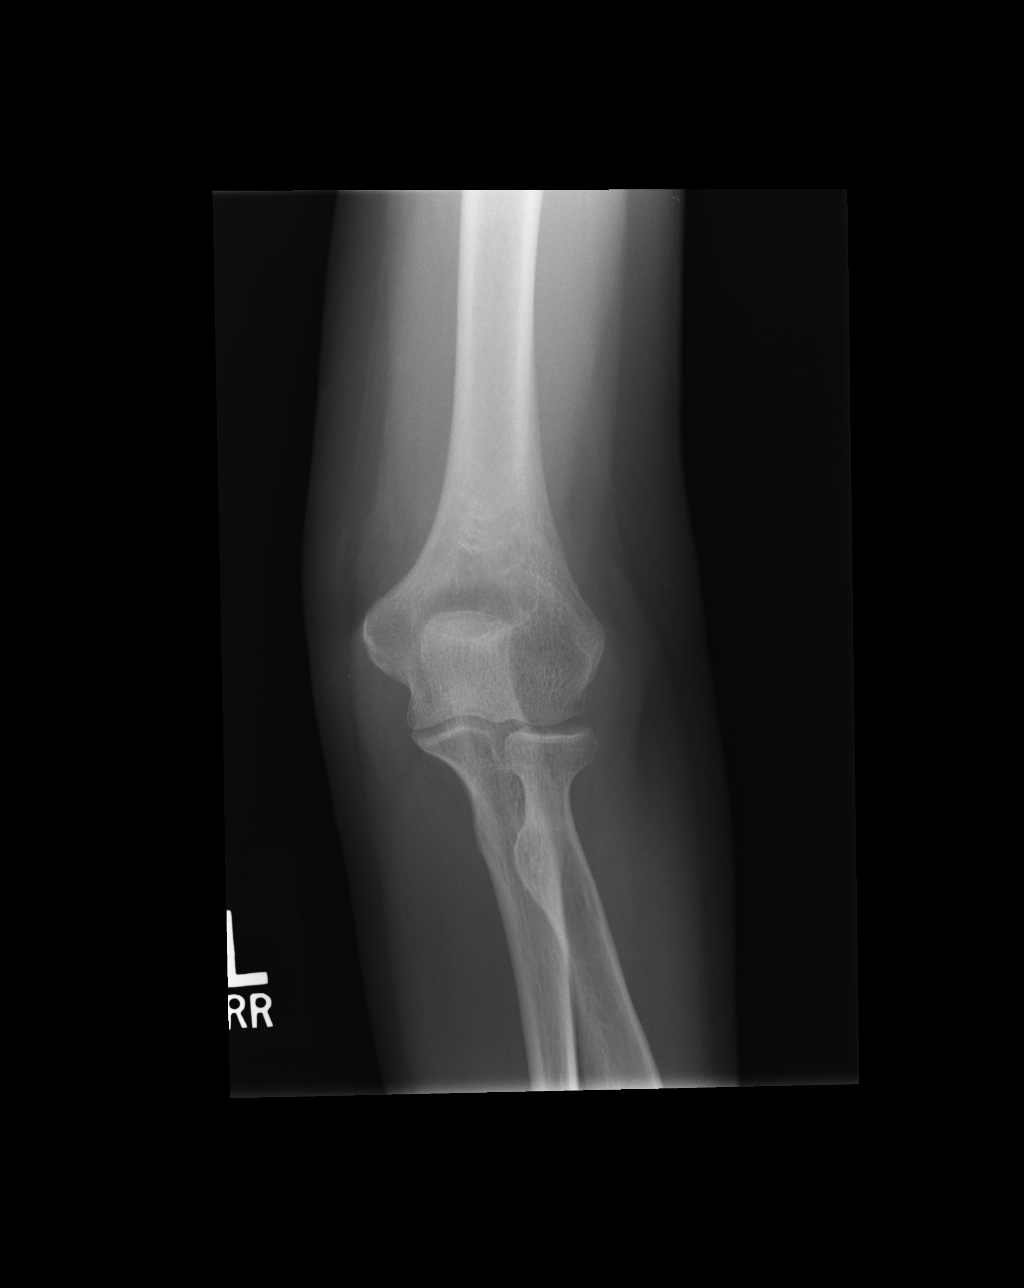

[ap obl int rot]
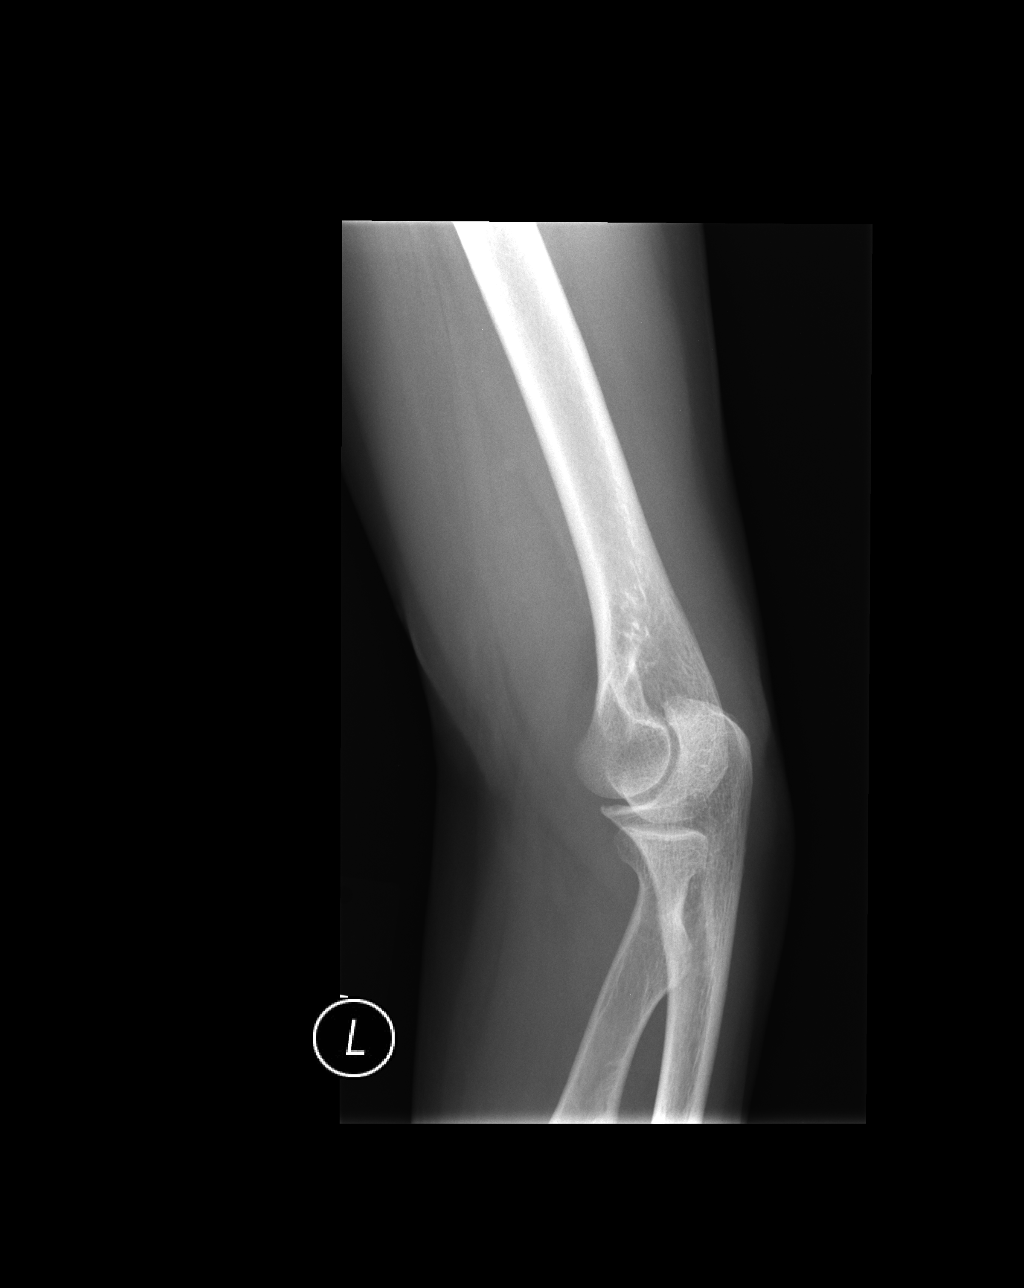

[4 of 4 positions shown; findings below may reference images not displayed]

FINDINGS: The bones are adequately mineralized. There is subtle cortical
irregularity of the lateral aspect of the radial head. A definite
fracture line is not demonstrated. The adjacent ulna is intact. The
condylar and supracondylar portions of the distal humerus are
intact. A posterior fat pad sign is consistent with a small
effusion.
IMPRESSION: There may be minimal impaction of the radial head but a classic
radial head fracture is not demonstrated. A joint effusion is
present however. No olecranon or distal humeral fracture is
demonstrated.

## 2016-02-24 ENCOUNTER — Ambulatory Visit (INDEPENDENT_AMBULATORY_CARE_PROVIDER_SITE_OTHER): Payer: BLUE CROSS/BLUE SHIELD | Admitting: Physician Assistant

## 2016-02-24 VITALS — BP 138/76 | HR 60 | Temp 98.3°F | Resp 16 | Ht 65.5 in | Wt 131.6 lb

## 2016-02-24 DIAGNOSIS — S0502XA Injury of conjunctiva and corneal abrasion without foreign body, left eye, initial encounter: Secondary | ICD-10-CM | POA: Diagnosis not present

## 2016-02-24 MED ORDER — TOBRAMYCIN 0.3 % OP SOLN: 1.0000 [drp] | OPHTHALMIC | Status: AC

## 2016-02-24 NOTE — Patient Instructions (Signed)
     IF you received an x-ray today, you will receive an invoice from [facility]. Please contact [facility] at [phone] with questions or concerns regarding your invoice.   IF you received labwork today, you will receive an invoice from [facility]. Please contact [facility] at [phone] with questions or concerns regarding your invoice.   Our billing staff will not be able to assist you with questions regarding bills from these companies.  You will be contacted with the lab results as soon as they are available. The fastest way to get your results is to activate your My Chart account. Instructions are located on the last page of this paperwork. If you have not heard from us regarding the results in 2 weeks, please contact this office.      

## 2016-02-24 NOTE — Progress Notes (Signed)
Subjective:     Patient ID: Savannah Bird, female   DOB: Oct 05, 1956, 60 y.o.   MRN: [id number]  HPI Savannah Bird is a 60 yo female that presents today for left eye pain.  This afternoon around 2 pm she was gardening when she went to reach down and the stem of a Hydrangea bush jabbed her left eye. She states that her vision is blurry but she is able to see. She does not wear contacts. She wears reading glasses. She states it is painful when she moves her eyes. She says "it feels like something is in my eye". She endorses photophobia and watery discharge. Last tetanus shot was 2010.    Review of Systems  Constitutional: Negative for fever and chills.  Eyes: Positive for photophobia, pain, discharge, redness and visual disturbance. Negative for itching.  Respiratory: Negative for shortness of breath.   Cardiovascular: Negative for chest pain.  Gastrointestinal: Negative for nausea, vomiting and diarrhea.       Objective:   Physical Exam  Constitutional: She is oriented to person, place, and time. She appears well-developed and well-nourished. No distress.  Eyes: EOM are normal. Pupils are equal, round, and reactive to light. Left eye exhibits chemosis and discharge (Watery). No foreign body present in the left eye. Left conjunctiva is injected. Left conjunctiva has no hemorrhage.  Slit lamp exam:      The left eye shows corneal abrasion and fluorescein uptake. The left eye shows no corneal ulcer and no foreign body.  Cardiovascular: Normal rate and regular rhythm.   Pulmonary/Chest: Effort normal and breath sounds normal.  Neurological: She is alert and oriented to person, place, and time.  Skin: Skin is warm and dry.  Psychiatric: She has a normal mood and affect. Her behavior is normal. Judgment and thought content normal.       Assessment/Plan:    1. Corneal abrasion, left, initial encounter Significant fluorescent uptake consistent with conjunctival and corneal abrasion. Vision is  unchanged in left eye. She does not wear contact lenses. Provided antibiotic drops and will see patient back on Wednesday for re evaluation. - tobramycin (TOBREX) 0.3 % ophthalmic solution; Place 1 drop into the left eye every 4 (four) hours.  Dispense: 5 mL; Refill: 0  Melina Schools PA-S 02/24/2016

## 2016-02-24 NOTE — Progress Notes (Signed)
   Patient ID: Savannah Bird, female    DOB: 02/21/1956, 60 y.o.   MRN: [id number]  PCP: No primary care provider on file.  Subjective:   Chief Complaint  Patient presents with  . Eye Pain    stuck a stick in left eye gardening today    HPI Presents for evaluation of left eye pain.  This afternoon around 2 pm she was gardening when she reached down and the stem of a Hydrangea bush jabbed her left eye. She states that her vision is blurry but she is able to see. She does not wear contacts. She wears reading glasses. She states it is painful when she moves her eyes. She says "it feels like something is in my eye". She endorses photophobia and watery discharge. Last tetanus shot was 2010.    Review of Systems Constitutional: Negative for fever and chills.  Eyes: Positive for photophobia, pain, discharge, redness and visual disturbance. Negative for itching.  Respiratory: Negative for shortness of breath.  Cardiovascular: Negative for chest pain.  Gastrointestinal: Negative for nausea, vomiting and diarrhea.     Patient Active Problem List   Diagnosis Date Noted  . MOTION SICKNESS 04/27/2009  . OSTEOPENIA 01/22/2009  . ALLERGIC RHINITIS 09/25/2008  . ASTHMA 09/25/2008     Prior to Admission medications   Not on File     Allergies  Allergen Reactions  . Doxycycline     Muscle cramping       Objective:  Physical Exam  Constitutional: She is oriented to person, place, and time. She appears well-developed and well-nourished. She is active and cooperative. No distress.  BP 138/76 mmHg  Pulse 60  Temp(Src) 98.3 F (36.8 C) (Oral)  Resp 16  Ht 5' 5.5" (1.664 m)  Wt 131 lb 9.6 oz (59.693 kg)  BMI 21.56 kg/m2  SpO2 98%  LMP 06/17/2009   Eyes: EOM are normal. Pupils are equal, round, and reactive to light. Right eye exhibits no chemosis, no discharge, no exudate and no hordeolum. No foreign body present in the right eye. Left eye exhibits discharge (watery). Left eye  exhibits no chemosis, no exudate and no hordeolum. No foreign body present in the left eye. Right conjunctiva is not injected. Right conjunctiva has no hemorrhage. Left conjunctiva is injected (mildly). Left conjunctiva has no hemorrhage. No scleral icterus.    Mild erythema of the lids of the LEFT eye. Local anesthesia with proparacaine. Significant fluorescein uptake revealing an abrasion extending from the conjunctiva medially below the iris the the cornea near the middle of the pupil. Irrigated copiously and 2 gtts of tobramycin 0.3% instilled in the eye.  Pulmonary/Chest: Effort normal.  Neurological: She is alert and oriented to person, place, and time.  Psychiatric: She has a normal mood and affect. Her speech is normal and behavior is normal.        Visual Acuity Screening   Right eye Left eye Both eyes  Without correction: 20/30 20/25 20/20   With correction:          Assessment & Plan:   1. Corneal abrasion, left, initial encounter Conjunctival and corneal abrasion. Tobramycin solution to prevent infection. Anticipatory guidance. Recheck in 48-72 hours, sooner if needed. - tobramycin (TOBREX) 0.3 % ophthalmic solution; Place 1 drop into the left eye every 4 (four) hours.  Dispense: 5 mL; Refill: 0   Fara Chute, PA-C Physician Assistant-Certified [facility]

## 2016-02-27 ENCOUNTER — Ambulatory Visit (INDEPENDENT_AMBULATORY_CARE_PROVIDER_SITE_OTHER): Payer: BLUE CROSS/BLUE SHIELD | Admitting: Physician Assistant

## 2016-02-27 VITALS — BP 112/70 | HR 57 | Temp 98.3°F | Resp 18 | Ht 65.5 in | Wt 132.8 lb

## 2016-02-27 DIAGNOSIS — S0502XD Injury of conjunctiva and corneal abrasion without foreign body, left eye, subsequent encounter: Secondary | ICD-10-CM | POA: Diagnosis not present

## 2016-02-27 NOTE — Progress Notes (Signed)
   Subjective:    Patient ID: Savannah Bird, female    DOB: 10-11-1956, 60 y.o.   MRN: [id number]  Chief Complaint  Patient presents with  . Follow-up    for left eye corneal abrasion.     HPI Patient here for follow up of left eye corneal abrasion that she sustained x3 days ago.   She was given tobramycin eye drop. She is tolerating the eye drops.   She states her vision is improving. Denies any diplopia, blurry vision, or decreased vision.  The pain has decreased. Some soreness with certain eye movements but overall improving. Denies any swelling or itching. Endorses redness but it is improving as well.  Current Outpatient Prescriptions on File Prior to Visit  Medication Sig Dispense Refill  . tobramycin (TOBREX) 0.3 % ophthalmic solution Place 1 drop into the left eye every 4 (four) hours. 5 mL 0   No current facility-administered medications on file prior to visit.   Allergies  Allergen Reactions  . Doxycycline     Muscle cramping        Review of Systems  Constitutional: Negative for fever and chills.  HENT: Negative.   Eyes: Positive for pain (Mild but improving) and redness. Negative for photophobia, discharge, itching and visual disturbance.  Respiratory: Negative for shortness of breath.   Cardiovascular: Negative for chest pain.  Gastrointestinal: Negative for nausea, vomiting, abdominal pain and diarrhea.  Neurological: Negative for dizziness, light-headedness and headaches.       Objective:   Physical Exam  Constitutional: She is oriented to person, place, and time. She appears well-developed and well-nourished.  HENT:  Head: Normocephalic and atraumatic.  Eyes: EOM and lids are normal. Pupils are equal, round, and reactive to light. Lids are everted and swept, no foreign bodies found. Left eye exhibits no discharge. No foreign body present in the left eye. Right conjunctiva is not injected. Left conjunctiva is injected. Left conjunctiva has no  hemorrhage.  Slit lamp exam:      The left eye shows corneal abrasion (Small conjunctival abrasion remains but significantly improved from last exam) and fluorescein uptake. The left eye shows no corneal ulcer, no foreign body, no hyphema, no hypopyon and no anterior chamber bulge.  Cardiovascular: Normal rate, regular rhythm and normal heart sounds.   Pulmonary/Chest: Effort normal and breath sounds normal.  Neurological: She is alert and oriented to person, place, and time.  Skin: Skin is warm and dry.  Psychiatric: She has a normal mood and affect. Her behavior is normal. Judgment and thought content normal.   Filed Vitals:   02/27/16 1321  BP: 112/70  Pulse: 57  Temp: 98.3 F (36.8 C)  Resp: 18           Assessment & Plan:  1. Corneal abrasion, left, subsequent encounter Continues to improve. Only small fluorescence up take on left conjunctiva. Tolerating eye drops without side effects. Vision, pain and swelling improving. Continue Tobramycin eye drops for 7 days total or until bottle is empty.  Melina Schools PA-S 02/27/2016

## 2016-02-27 NOTE — Patient Instructions (Addendum)
Continue the eye drops until you complete the bottle, or have used them for 7 days, whichever is first.  If you have persistent symptoms, or any new symptoms, please return here or see your eye specialist.    IF you received an x-ray today, you will receive an invoice from [facility]. Please contact [facility] at [phone] with questions or concerns regarding your invoice.   IF you received labwork today, you will receive an invoice from [employer]. Please contact [facility] at [phone] with questions or concerns regarding your invoice.   Our billing staff will not be able to assist you with questions regarding bills from these companies.  You will be contacted with the lab results as soon as they are available. The fastest way to get your results is to activate your My Chart account. Instructions are located on the last page of this paperwork. If you have not heard from [place] regarding the results in 2 weeks, please contact this office.

## 2016-03-01 NOTE — Progress Notes (Signed)
Chief Complaint  Patient presents with  . Follow-up    for left eye corneal abrasion.     History of Present Illness: Patient presents for follow up of left eye corneal abrasion that she sustained x3 days ago.   She was given tobramycin eye drops, which she is tolerating well.   She states her vision is improving. Denies any drainage, diplopia, blurry vision, or decreased vision.  The pain has decreased. Some soreness with certain eye movements but overall improving. Denies any swelling or itching. Endorses redness but it is improving as well.   Allergies  Allergen Reactions  . Doxycycline     Muscle cramping    Prior to Admission medications   Medication Sig Start Date End Date Taking? Authorizing Provider  tobramycin (TOBREX) 0.3 % ophthalmic solution Place 1 drop into the left eye every 4 (four) hours. 02/24/16 03/02/16 Yes Harrison Mons, PA-C    Patient Active Problem List   Diagnosis Date Noted  . MOTION SICKNESS 04/27/2009  . OSTEOPENIA 01/22/2009  . ALLERGIC RHINITIS 09/25/2008  . ASTHMA 09/25/2008     Physical Exam  Constitutional: She is oriented to person, place, and time. She appears well-developed and well-nourished. She is active and cooperative. No distress.  BP 112/70 mmHg  Pulse 57  Temp(Src) 98.3 F (36.8 C) (Oral)  Resp 18  Ht 5' 5.5" (1.664 m)  Wt 132 lb 12.8 oz (60.238 kg)  BMI 21.76 kg/m2  SpO2 99%  LMP 06/17/2009   Eyes: Conjunctivae, EOM and lids are normal. Pupils are equal, round, and reactive to light. Right eye exhibits no chemosis, no discharge, no exudate and no hordeolum. No foreign body present in the right eye. Left eye exhibits no chemosis, no discharge, no exudate and no hordeolum. No foreign body present in the left eye. No scleral icterus.  Proparacaine to anesthetize and stained with fluorescein. Stain uptake only of the conjunctiva, just below the inferior edge of the iris, and smaller than previous exam. Lid everted. No FB. Stain  rinsed with saline.  Pulmonary/Chest: Effort normal.  Neurological: She is alert and oriented to person, place, and time.  Psychiatric: She has a normal mood and affect. Her speech is normal and behavior is normal.    Visual Acuity Screening   Right eye Left eye Both eyes  Without correction: 20/20-1 20/15 20/13-1  With correction:         ASSESSMENT & PLAN:  1. Corneal abrasion, left, subsequent encounter Corneal portion of abrasion is resolved. Conjunctival portion is reduced in size by 40%. Continue tobramycin gtts to complete 7 days.    Fara Chute, PA-C Physician Assistant-Certified [facility]

## 2016-12-10 ENCOUNTER — Telehealth: Payer: Self-pay | Admitting: Certified Nurse Midwife

## 2016-12-10 NOTE — Telephone Encounter (Signed)
Patient experiencing lower left abdominal pressure and pain.  Would like to speak with someone.

## 2016-12-10 NOTE — Telephone Encounter (Signed)
Spoke with nursing supervisor -advised last OV 10/06/2013 -was advised ok to schedule with provider.    Spoke with patient. Patient states she has been experiencing left sided pelvic pain with pressure and burning that has been increasing over last couple of weeks. Patient states hurts more at night when lying down. Patient denies any urinary complaints, vaginal discharge, bleeding, fever or nausea. Patient states she is postmenopausal. Patient states she thought maybe was UTI related and purchased "UTI" test strips at pharmacy and reports negative for bacteria. Recommended OV for further evaluation, patient declined OV for today at 2:30pm with Dr. Talbert Nan d/t work schedule. Patient scheduled for OV 12/11/16 at 1:45pm with Dr. Talbert Nan. Patient is agreeable to date and time.  Routing to provider for final review. Patient is agreeable to disposition. Will close encounter.

## 2016-12-11 ENCOUNTER — Encounter: Payer: Self-pay | Admitting: Obstetrics and Gynecology

## 2016-12-11 ENCOUNTER — Ambulatory Visit (INDEPENDENT_AMBULATORY_CARE_PROVIDER_SITE_OTHER): Payer: BLUE CROSS/BLUE SHIELD | Admitting: Obstetrics and Gynecology

## 2016-12-11 VITALS — BP 102/70 | HR 88 | Resp 16 | Ht 65.5 in | Wt 134.0 lb

## 2016-12-11 DIAGNOSIS — N952 Postmenopausal atrophic vaginitis: Secondary | ICD-10-CM | POA: Diagnosis not present

## 2016-12-11 DIAGNOSIS — R300 Dysuria: Secondary | ICD-10-CM

## 2016-12-11 DIAGNOSIS — N762 Acute vulvitis: Secondary | ICD-10-CM

## 2016-12-11 DIAGNOSIS — R1030 Lower abdominal pain, unspecified: Secondary | ICD-10-CM

## 2016-12-11 LAB — POCT URINALYSIS DIPSTICK
Bilirubin, UA: NEGATIVE
Blood, UA: NEGATIVE
GLUCOSE UA: NEGATIVE
Ketones, UA: NEGATIVE
Leukocytes, UA: NEGATIVE
NITRITE UA: NEGATIVE
PROTEIN UA: NEGATIVE
UROBILINOGEN UA: NEGATIVE
pH, UA: 6

## 2016-12-11 NOTE — Progress Notes (Signed)
GYNECOLOGY  VISIT   HPI: 61 y.o.   Divorced  Caucasian  female   G1P1001 with Patient's last menstrual period was 06/17/2009.   here for  Lower abdominal pain. She c/o a 2 week h/o intermittent LLQ "burning". Felt like a burning pain, deep inside. Intense at time, up to a 7/10 in severity. She tried azo and it helped some.  No urinary frequency, no urinary urgency. Burning with urination only externally. She was having some vulvar irritation,not today. No abnormal vaginal d/c. Some perianal burning, better. No fevers, no nausea, no constipation, no diarrhea. Pain would worsen a few hours after exercise.  She feels some vaginal pressure when she is in certain positions.   GYNECOLOGIC HISTORY: Patient's last menstrual period was 06/17/2009. Contraception: post menopausal Menopausal hormone therapy: none        OB History    Gravida Para Term Preterm AB Living   1 1 1     1    SAB TAB Ectopic Multiple Live Births           1         Patient Active Problem List   Diagnosis Date Noted  . MOTION SICKNESS 04/27/2009  . OSTEOPENIA 01/22/2009  . ALLERGIC RHINITIS 09/25/2008  . ASTHMA 09/25/2008    Past Medical History:  Diagnosis Date  . Anxiety   . Childhood asthma   . Depression   . Fibroid     Past Surgical History:  Procedure Laterality Date  . CESAREAN SECTION    . DILATION AND CURETTAGE OF UTERUS      Current Outpatient Prescriptions  Medication Sig Dispense Refill  . Homeopathic Products (AZO CONFIDENCE PO) Take by mouth daily.     No current facility-administered medications for this visit.      ALLERGIES: Doxycycline  Family History  Problem Relation Age of Onset  . Thyroid disease Mother   . Stroke Father   . Heart disease Father   . Hypertension Father   . Cancer Father     bladder & kidney  . Aneurysm Maternal Grandmother   . Heart disease Maternal Grandfather   . Cancer Paternal Grandmother     ovarian    Social History   Social History  .  Marital status: Divorced    Spouse name: N/A  . Number of children: N/A  . Years of education: N/A   Occupational History  . Not on file.   Social History Main Topics  . Smoking status: Never Smoker  . Smokeless tobacco: Not on file  . Alcohol use 0.6 oz/week    1 Glasses of wine, 7 - 10 Standard drinks or equivalent per week  . Drug use: No  . Sexual activity: No   Other Topics Concern  . Not on file   Social History Narrative  . No narrative on file    Review of Systems  Constitutional: Negative.   HENT: Positive for hearing loss.   Eyes: Negative.   Respiratory: Negative.   Cardiovascular: Negative.   Gastrointestinal: Positive for abdominal pain.  Genitourinary: Positive for dysuria.  Musculoskeletal: Negative.   Skin: Negative.   Neurological: Negative.   Endo/Heme/Allergies: Negative.   Psychiatric/Behavioral: Negative.     PHYSICAL EXAMINATION:    BP 102/70 (BP Location: Right Arm, Patient Position: Sitting, Cuff Size: Normal)   Pulse 88   Resp 16   Ht 5' 5.5" (1.664 m)   Wt 134 lb (60.8 kg)   LMP 06/17/2009   BMI 21.96 kg/m  General appearance: alert, cooperative and appears stated age Abdomen: soft, non-tender;mildly distended; no masses,  no organomegaly  Pelvic: External genitalia:  no lesions, atrophic, minimal erythema              Urethra:  normal appearing urethra with no masses, tenderness or lesions              Bartholins and Skenes: normal                 Vagina: normal appearing atrophic vagina with normal color and discharge, no lesions              Cervix: no lesions              Bimanual Exam:  Uterus:  normal size, contour, position, consistency, mobility, non-tender and anteverted              Adnexa: no mass, fullness, tenderness              Rectovaginal: Yes.  .  Confirms.              Anus:  normal sphincter tone, no lesions  Mild peri-anal erythema  Chaperone was present for exam.  ASSESSMENT 2 week h/o intermittent LLQ  burning abdominal pain. No GI symptoms Symptoms improved with Azo, but no other urinary c/o (?coincidental)  Minor vulvar symptoms Vaginal atrophy   PLAN Check urine for ua, c&s Wet prep probe Use Vaseline or coconut oil externally Can use coconut oil or replense vaginally I would recommend f/u with her primary with recurrent pain (if negative evaluation there, consider gyn ultrasound) Can take ibuprofen for pain   An After Visit Summary was printed and given to the patient.

## 2016-12-12 LAB — WET PREP BY MOLECULAR PROBE
CANDIDA SPECIES: NEGATIVE
Gardnerella vaginalis: NEGATIVE
TRICHOMONAS VAG: NEGATIVE

## 2016-12-12 LAB — URINALYSIS, MICROSCOPIC ONLY
BACTERIA UA: NONE SEEN [HPF]
Casts: NONE SEEN [LPF]
Crystals: NONE SEEN [HPF]
RBC / HPF: NONE SEEN RBC/HPF (ref <=2)
Squamous Epithelial / LPF: NONE SEEN [HPF] (ref <=5)
WBC UA: NONE SEEN WBC/HPF (ref <=5)
YEAST: NONE SEEN [HPF]

## 2016-12-12 LAB — URINE CULTURE: ORGANISM ID, BACTERIA: NO GROWTH
